# Patient Record
Sex: Male | Born: 1935 | Hispanic: No | Smoking: Never smoker
Health system: Southern US, Community
[De-identification: ages and names within clinical notes are randomized; demographics above are authoritative.]

## PROBLEM LIST (undated history)

## (undated) DIAGNOSIS — I10 Essential (primary) hypertension: Secondary | ICD-10-CM

## (undated) HISTORY — DX: Essential (primary) hypertension: I10

## (undated) HISTORY — PX: CATARACT EXTRACTION: SUR2

---

## 2001-10-22 ENCOUNTER — Emergency Department (HOSPITAL_COMMUNITY): Admission: EM | Admit: 2001-10-22 | Discharge: 2001-10-22 | Payer: Self-pay

## 2009-06-16 ENCOUNTER — Observation Stay (HOSPITAL_COMMUNITY): Admission: EM | Admit: 2009-06-16 | Discharge: 2009-06-16 | Payer: Self-pay | Admitting: Emergency Medicine

## 2010-07-31 LAB — URINALYSIS, ROUTINE W REFLEX MICROSCOPIC
Leukocytes, UA: NEGATIVE
Protein, ur: NEGATIVE mg/dL
Specific Gravity, Urine: 1.006 (ref 1.005–1.030)
pH: 6.5 (ref 5.0–8.0)

## 2010-07-31 LAB — POCT I-STAT, CHEM 8
Calcium, Ion: 0.94 mmol/L — ABNORMAL LOW (ref 1.12–1.32)
Creatinine, Ser: 0.9 mg/dL (ref 0.4–1.5)
Glucose, Bld: 93 mg/dL (ref 70–99)
HCT: 39 % (ref 39.0–52.0)
Hemoglobin: 13.3 g/dL (ref 13.0–17.0)
Potassium: 3.1 mEq/L — ABNORMAL LOW (ref 3.5–5.1)
TCO2: 22 mmol/L (ref 0–100)

## 2010-07-31 LAB — POCT CARDIAC MARKERS
CKMB, poc: <1 ng/mL — ABNORMAL LOW (ref 1.0–8.0)
Myoglobin, poc: 65.6 ng/mL (ref 12–200)
Myoglobin, poc: 66.5 ng/mL (ref 12–200)
Troponin i, poc: <0.05 ng/mL (ref 0.00–0.09)
Troponin i, poc: <0.05 ng/mL (ref 0.00–0.09)

## 2010-11-09 IMAGING — CR DG CHEST 2V
2 series · 2 of 2 positions shown · non-contrast
Comparison: None.

CLINICAL DATA: Fever.  Syncope.  Flu-like symptoms.

CHEST - 2 VIEW

[view not recorded (1 of 2)]
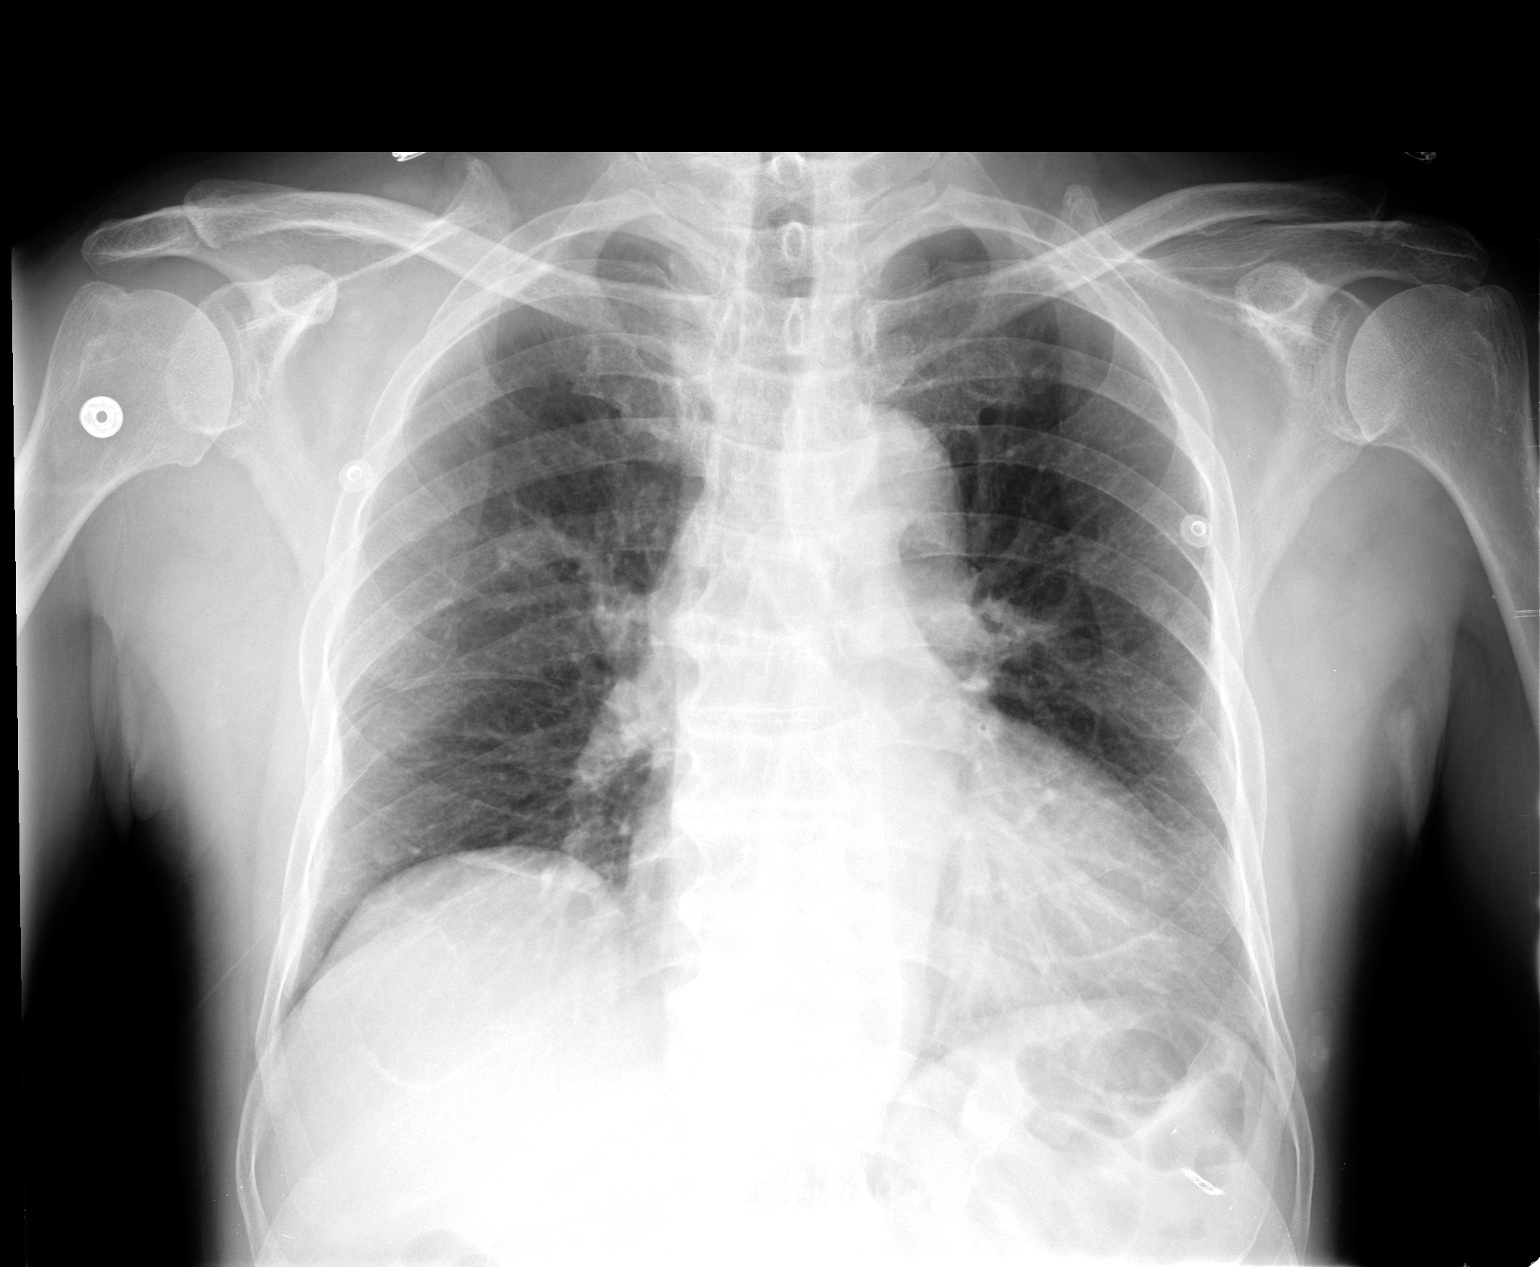

[view not recorded (2 of 2)]
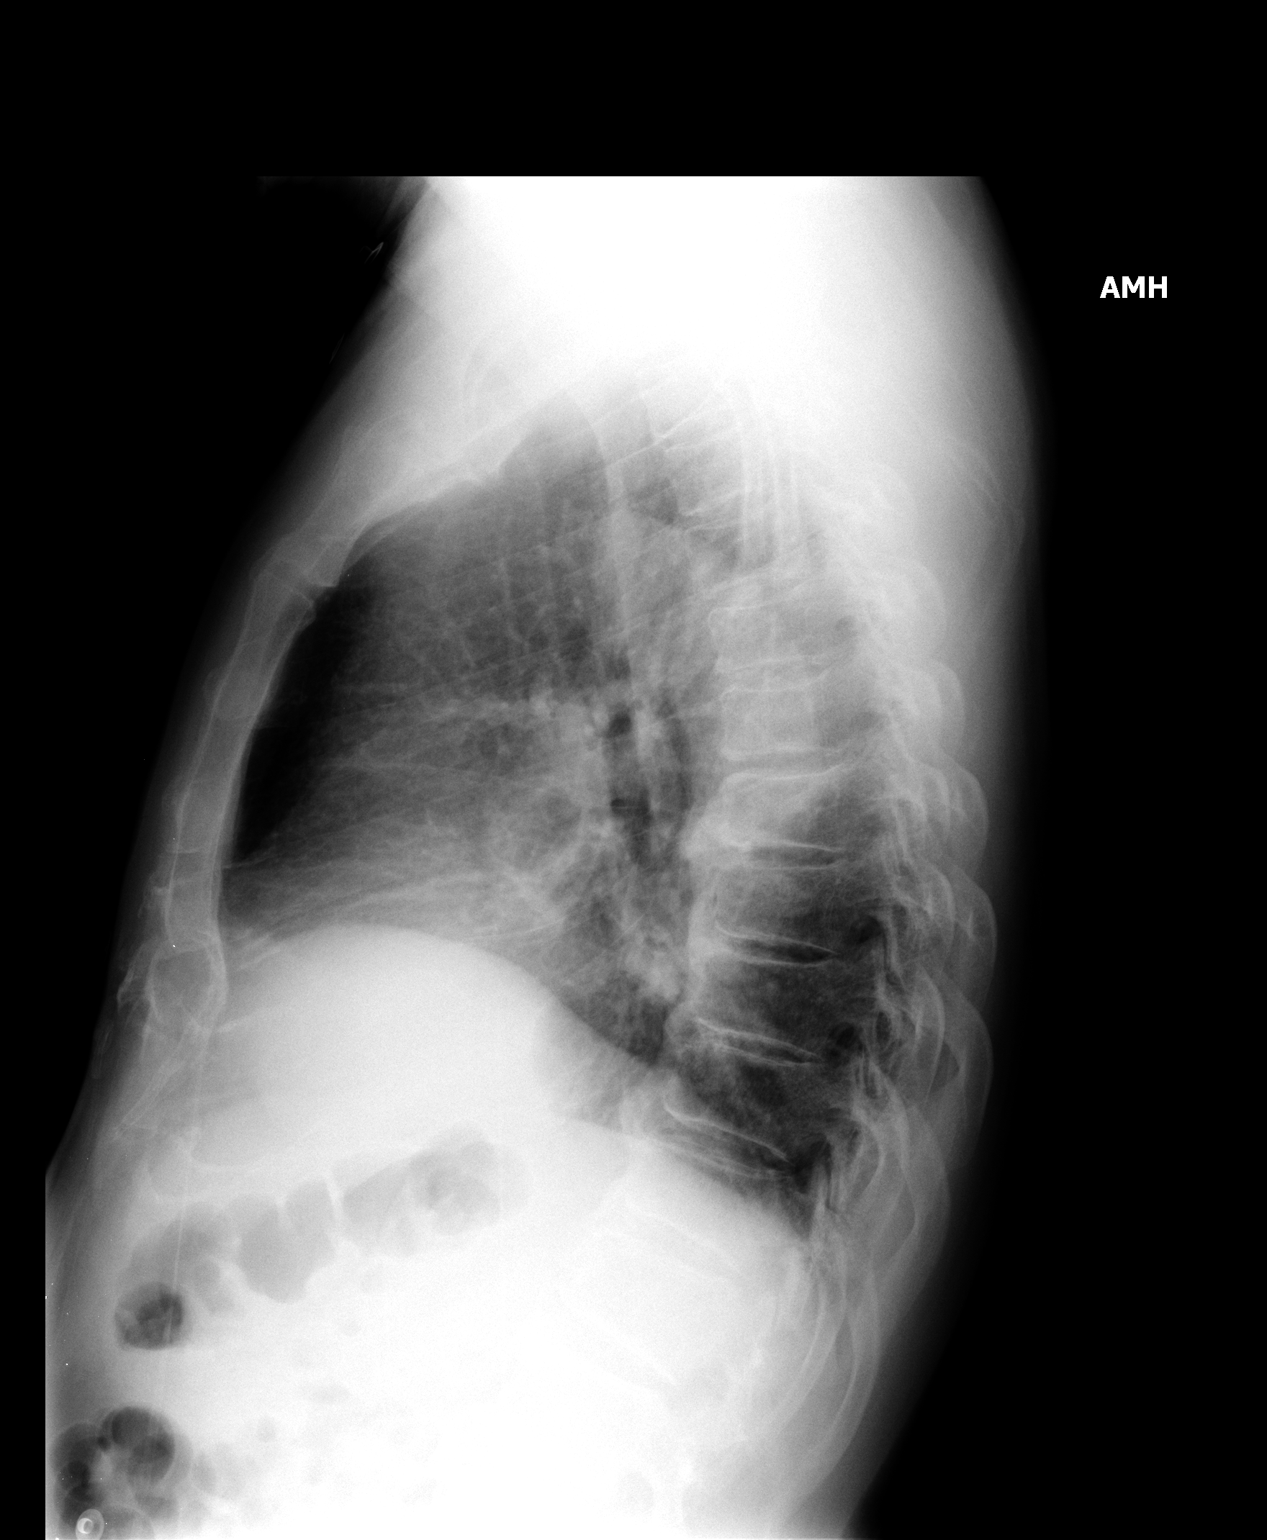

[2 of 2 positions shown; findings below may reference images not displayed]

FINDINGS: There is mild cardiomegaly.  Vascularity is normal.
There is a 5 mm nodule in the left midlung zone.  There is a 8 mm
nodule at the right lung base which is probably a nipple shadow
were scanned lesion, being been very well demarcated.  There is a
vague 12 mm density in the right midlung zone overlying the
posterior aspect of the right sixth rib which may represent a
confluence of normal structures but is more dense than the
surrounding tissues the possibility of a small nodule should be
considered.

There are no acute infiltrates or effusions.  Osteophytes are
present throughout the thoracic spine.
IMPRESSION: No pneumonia or effusions.

Mild cardiomegaly.

Possible nodules in the right and left midlung zones.  No prior
studies available for comparison.  CT scan may be useful for
further evaluation on an elective basis.

## 2014-03-02 ENCOUNTER — Encounter: Payer: Self-pay | Admitting: Family Medicine

## 2014-03-02 ENCOUNTER — Ambulatory Visit (INDEPENDENT_AMBULATORY_CARE_PROVIDER_SITE_OTHER): Payer: Medicare Other | Admitting: Family Medicine

## 2014-03-02 VITALS — BP 152/92 | HR 70 | Temp 98.2°F | Resp 16 | Ht 64.5 in | Wt 143.0 lb

## 2014-03-02 DIAGNOSIS — R350 Frequency of micturition: Secondary | ICD-10-CM

## 2014-03-02 DIAGNOSIS — R519 Headache, unspecified: Secondary | ICD-10-CM

## 2014-03-02 DIAGNOSIS — G8929 Other chronic pain: Secondary | ICD-10-CM

## 2014-03-02 DIAGNOSIS — N400 Enlarged prostate without lower urinary tract symptoms: Secondary | ICD-10-CM

## 2014-03-02 DIAGNOSIS — R319 Hematuria, unspecified: Secondary | ICD-10-CM

## 2014-03-02 DIAGNOSIS — R51 Headache: Secondary | ICD-10-CM

## 2014-03-02 DIAGNOSIS — I10 Essential (primary) hypertension: Secondary | ICD-10-CM

## 2014-03-02 LAB — POCT URINALYSIS DIPSTICK
Bilirubin, UA: NEGATIVE
Glucose, UA: NEGATIVE
Ketones, UA: NEGATIVE
Leukocytes, UA: NEGATIVE
Nitrite, UA: NEGATIVE
Protein, UA: NEGATIVE
Spec Grav, UA: 1.015
Urobilinogen, UA: 0.2
pH, UA: 7

## 2014-03-02 LAB — COMPLETE METABOLIC PANEL WITH GFR
ALT: 9 U/L (ref 0–53)
AST: 25 U/L (ref 0–37)
Alkaline Phosphatase: 53 U/L (ref 39–117)
BUN: 14 mg/dL (ref 6–23)
CO2: 29 mEq/L (ref 19–32)
Calcium: 9.2 mg/dL (ref 8.4–10.5)

## 2014-03-02 LAB — POCT UA - MICROSCOPIC ONLY
Bacteria, U Microscopic: NEGATIVE
Casts, Ur, LPF, POC: NEGATIVE
Crystals, Ur, HPF, POC: NEGATIVE
Mucus, UA: NEGATIVE
Yeast, UA: NEGATIVE

## 2014-03-02 LAB — POCT CBC
Granulocyte percent: 58.8 % (ref 37–80)
HCT, POC: 43.2 % — AB (ref 43.5–53.7)
Hemoglobin: 13.8 g/dL — AB (ref 14.1–18.1)
Lymph, poc: 2.1 (ref 0.6–3.4)
MCH, POC: 29.8 pg (ref 27–31.2)
MCHC: 32 g/dL (ref 31.8–35.4)
MCV: 93 fL (ref 80–97)
MID (cbc): 0.2 (ref 0–0.9)
MPV: 7.6 fL (ref 0–99.8)
POC Granulocyte: 3.2 (ref 2–6.9)
POC LYMPH PERCENT: 38 % (ref 10–50)
POC MID %: 3.2 %M (ref 0–12)
Platelet Count, POC: 177 10*3/uL (ref 142–424)
RBC: 4.65 M/uL — AB (ref 4.69–6.13)
RDW, POC: 14.3 %
WBC: 5.4 10*3/uL (ref 4.6–10.2)

## 2014-03-02 LAB — COMPLETE METABOLIC PANEL WITHOUT GFR
Albumin: 4.4 g/dL (ref 3.5–5.2)
Chloride: 101 meq/L (ref 96–112)
Creat: 0.82 mg/dL (ref 0.50–1.35)
GFR, Est African American: >89 mL/min
GFR, Est Non African American: 85 mL/min
Glucose, Bld: 88 mg/dL (ref 70–99)
Potassium: 4 meq/L (ref 3.5–5.3)
Sodium: 137 meq/L (ref 135–145)
Total Bilirubin: 0.8 mg/dL (ref 0.2–1.2)
Total Protein: 7.7 g/dL (ref 6.0–8.3)

## 2014-03-02 MED ORDER — AMLODIPINE BESYLATE 2.5 MG PO TABS: 2.5000 mg | ORAL_TABLET | Freq: Every day | ORAL | Status: AC

## 2014-03-02 MED ORDER — TAMSULOSIN HCL 0.4 MG PO CAPS: 0.4000 mg | ORAL_CAPSULE | Freq: Every day | ORAL | Status: AC

## 2014-03-02 NOTE — Patient Instructions (Signed)
Benign Prostatic Hyperplasia An enlarged prostate (benign prostatic hyperplasia) is common in older men. You may experience the following:  Weak urine stream.  Dribbling.  Feeling like the bladder has not emptied completely.  Difficulty starting urination.  Getting up frequently at night to urinate.  Urinating more frequently during the day. HOME CARE INSTRUCTIONS  Monitor your prostatic hyperplasia for any changes. The following actions may help to alleviate any discomfort you are experiencing:  Give yourself time when you urinate.  Stay away from alcohol.  Avoid beverages containing caffeine, such as coffee, tea, and colas, because they can make the problem worse.  Avoid decongestants, antihistamines, and some prescription medicines that can make the problem worse.  Follow up with your health care provider for further treatment as recommended. SEEK MEDICAL CARE IF:  You are experiencing progressive difficulty voiding.  Your urine stream is progressively getting narrower.  You are awaking from sleep with the urge to void more frequently.  You are constantly feeling the need to void.  You experience loss of urine, especially in small amounts. SEEK IMMEDIATE MEDICAL CARE IF:   You develop increased pain with urination or are unable to urinate.  You develop severe abdominal pain, vomiting, a high fever, or fainting.  You develop back pain or blood in your urine. MAKE SURE YOU:   Understand these instructions.  Will watch your condition.  Will get help right away if you are not doing well or get worse. Document Released: 04/27/2005 Document Revised: 12/28/2012 Document Reviewed: 09/27/2012 [facility] Patient Information 2015 [place], [place]. This information is not intended to replace advice given to you by your health care provider. Make sure you discuss any questions you have with your health care provider. Hypertension Hypertension, commonly called high blood  pressure, is when the force of blood pumping through your arteries is too strong. Your arteries are the blood vessels that carry blood from your heart throughout your body. A blood pressure reading consists of a higher number over a lower number, such as 110/72. The higher number (systolic) is the pressure inside your arteries when your heart pumps. The lower number (diastolic) is the pressure inside your arteries when your heart relaxes. Ideally you want your blood pressure below 120/80. Hypertension forces your heart to work harder to pump blood. Your arteries may become narrow or stiff. Having hypertension puts you at risk for heart disease, stroke, and other problems.  RISK FACTORS Some risk factors for high blood pressure are controllable. Others are not.  Risk factors you cannot control include:   Race. You may be at higher risk if you are African American.  Age. Risk increases with age.  Gender. Men are at higher risk than women before age 70 years. After age 18, women are at higher risk than men. Risk factors you can control include:  Not getting enough exercise or physical activity.  Being overweight.  Getting too much fat, sugar, calories, or salt in your diet.  Drinking too much alcohol. SIGNS AND SYMPTOMS Hypertension does not usually cause signs or symptoms. Extremely high blood pressure (hypertensive crisis) may cause headache, anxiety, shortness of breath, and nosebleed. DIAGNOSIS  To check if you have hypertension, your health care provider will measure your blood pressure while you are seated, with your arm held at the level of your heart. It should be measured at least twice using the same arm. Certain conditions can cause a difference in blood pressure between your right and left arms. A blood pressure reading  that is higher than normal on one occasion does not mean that you need treatment. If one blood pressure reading is high, ask your health care provider about having it  checked again. TREATMENT  Treating high blood pressure includes making lifestyle changes and possibly taking medicine. Living a healthy lifestyle can help lower high blood pressure. You may need to change some of your habits. Lifestyle changes may include:  Following the DASH diet. This diet is high in fruits, vegetables, and whole grains. It is low in salt, red meat, and added sugars.  Getting at least 2 hours of brisk physical activity every week.  Losing weight if necessary.  Not smoking.  Limiting alcoholic beverages.  Learning ways to reduce stress. If lifestyle changes are not enough to get your blood pressure under control, your health care provider may prescribe medicine. You may need to take more than one. Work closely with your health care provider to understand the risks and benefits. HOME CARE INSTRUCTIONS  Have your blood pressure rechecked as directed by your health care provider.   Take medicines only as directed by your health care provider. Follow the directions carefully. Blood pressure medicines must be taken as prescribed. The medicine does not work as well when you skip doses. Skipping doses also puts you at risk for problems.   Do not smoke.   Monitor your blood pressure at home as directed by your health care provider. SEEK MEDICAL CARE IF:   You think you are having a reaction to medicines taken.  You have recurrent headaches or feel dizzy.  You have swelling in your ankles.  You have trouble with your vision. SEEK IMMEDIATE MEDICAL CARE IF:  You develop a severe headache or confusion.  You have unusual weakness, numbness, or feel faint.  You have severe chest or abdominal pain.  You vomit repeatedly.  You have trouble breathing. MAKE SURE YOU:   Understand these instructions.  Will watch your condition.  Will get help right away if you are not doing well or get worse. Document Released: 04/27/2005 Document Revised: 09/11/2013  Document Reviewed: 02/17/2013 [facility] Patient Information 2015 [place]ExitCare, [place]. This information is not intended to replace advice given to you by your health care provider. Make sure you discuss any questions you have with your health care provider. Ph ??i tuy?n ti?n li?t lnh tnh  (Benign Prostatic Hypertrophy) Tuy?n ti?n li?t l m?t b? ph?n c?a h? sinh s?n ? nam gi?i. Tuy?n ti?n li?t bnh th??ng c kch th??c v hnh d?ng g?n gi?ng h?t d?. Tuy?n ti?n li?t t?o ra m?t lo?i d?ch tr?n l?n v?i tinh trng ?? t?o thnh tinh d?ch. Tuy?n ny bao quanh ni?u ??o v n?m ? m?t tr??c tr?c trng v ngay d??i bng quang. Bng quang l n?i ch?a n??c ti?u. Ni?u ??o l ?ng cho n??c ti?u t? bng quang ?i qua ?? ra kh?i c? th?. Tuy?n ti?n li?t to ra khi nam gi?i gi ?i. Tuy?n ti?n li?t ph ??i khng ph?i do b?nh ung th? gy ra ???c g?i l ph ??i tuy?n ti?n li?t lnh tnh (benign prostatic hypertrophy, BPH). Tuy?n ti?n li?t ph ??i c th? chn p [place] ni?u ??o. ?i?u ny c th? lm cho kh ?i ti?u h?n. Trong giai ?o?n ??u c?a s? ph ??i, bng quang c th? ???c ti?p c?n v?i m?t ni?u ??o thu h?p b?ng cch bu?c n??c ti?u ?i qua. N?u v?n ?? tr? nn t?i t? h?n, c th? ph?i c?n ?i?u tr? n?i khoa ho?c  ph?u thu?t.  Tnh tr?ng ny c?n ???c chuyn gia ch?m [place] s?c kh?e theo di. Tch t? n??c ti?u trong bng quang c th? gy nhi?m trng. p l?c ng??c dng v nhi?m trng c th? ti?n tri?n thnh t?n th??ng bng quang v suy (th?n). N?u c?n, chuyn gia ch?m [place] s?c kh?e c th? gi?i thi?u qu v? ??n m?t chuyn gia v? b?nh th?n v tuy?n ti?n li?t(bc s? chuyn khoa ti?t ni?u). NGUYN NHN  BPH l m?t v?n ?? v? s?c kh?e ph? bi?n ? nam gi?i trn 50 tu?i. Tnh tr?ng ny l m?t ph?n bnh th??ng c?a qu trnh lo ha. Tuy nhin, khng ph?i t?t c? nam gi?i ??u b? cc v?n ?? c?a tnh tr?ng ny. N?u ph ??i pht tri?n ra xa ni?u ??o, lc ?, s? khng c b?t c? chn p no ? ni?u ??o v khng c c?n tr? dng n??c ti?u. N?u ph ??i pht tri?n v? pha  ni?u ??o v gy chn p, qu v? s? kh ?i ti?u.  TRI?U CH?NG   Khng th? ?i h?t n??c ti?u trong bng quang.  Th?c d?y th??ng xuyn trong ?m ?? ?i ti?u.  C?n ?i ti?u th??ng xuyn trong ngy.  Kh kh?n khi b?t ??u ?i n??c ti?u.  Gi?m kch th??c v ?? m?nh c?a dng n??c ti?u.  Nh? gi?t n??c ti?u sau khi ?i ti?u.  ?au khi ?i ti?u (ph? bi?n h?n khi c nhi?m trng).  Khng th? ?i ti?u. Hi?n t??ng ny c?n ph?i ?i?u tr? ngay l?p t?c.  Pht tri?n nhi?m trng ???ng ti?u. CH?N ?ON  Cc xt nghi?m sau s? gip chuyn gia ch?m [place] s?c kh?e hi?u r v?n ?? c?a qu v?:  [place]hai thc k? ti?n s? v khm th?c th?.  Ti?n s? v? ti?t ni?u, v?i s? l?n ?i ti?u, l??ng n??c ti?u, ?? m?nh c?a dng n??c ti?u, v c?m gic h?t ho?c ??y bng quang sau khi ?i ti?u.  Ch?p qut l??ng n??c ti?u cn l?i trong bng quang ?? ?o l??ng n??c ti?u c th? v?n cn l?i trong bng quang sau khi qu v? ?i ti?u.  Ki?m tra tr?c trng b?ng ngn tay. Trong l?n ki?m tra tr?c trng, chuyn gia ch?m [place] s?c kh?e s? ki?m tra tuy?n ti?n li?t c?a qu v? b?ng cch cho m?t ngn tay ?eo g?ng, ? bi tr?n [place] tr?c trng ?? s? m?t sau c?a tuy?n ti?n li?t. Ki?m tra ny pht hi?n kch th??c c?a tuy?n v cc kh?i u ho?c s? pht tri?n b?t th??ng.  Xt nghi?m (phn tch n??c ti?u).  Sng l?c khng nguyn ??c hi?u tuy?n ti?n li?t (PSA). ?y l m?t xt nghi?m mu ???c s? d?ng ?? sng l?c ung th? tuy?n ti?n li?t.  Siu m tr?c trng. Ki?m tra ny s? d?ng sng m thanh ?? t?o hnh ?nh tuy?n ti?n li?t b?ng ph??ng php ?i?n t?. ?I?U TR?  Khi b?t ??u c tri?u ch?ng, chuyn gia ch?m [place] s?c kh?e s? theo di tnh tr?ng c?a qu v?. Trong s? nh?ng ng??i c tnh tr?ng ny, m?t ph?n ba s? c cc tri?u ch?ng ?n ??nh, m?t ph?n ba s? c cc tri?u ch?ng c?i thi?n, v m?t ph?n ba s? c cc tri?u ch?ng ti?n tri?n trong n?m ??u. Tri?u ch?ng nh? c th? khng c?n ?i?u tr?Cl[address] ??n gi?n v th?m khm hng n?m c th? l t?t c? nh?ng g c?n ph?i lm. Thu?c v ph?u  thu?t l nh?ng l?a ch?n ?i?u tr? nh?ng  v?n ?? nghim tr?ng h?n. Chuyn gia ch?m [place] s?c kh?e c th? gip qu v? ??a ra m?t quy?t ??nh sng su?t v? nh?ng ?i?u t?t nh?t. Hai lo?i thu?c c s?n ?? gi?m tri?u ch?ng c?a tuy?n ti?n li?t:  Thu?c lm thu nh? tuy?n ti?n li?t. Thu?c ny gip gi?m tri?u ch?ng. Nh?ng thu?c ny c?n c th?i gian ?? pht huy tc d?ng, v c th? m?t nhi?u thng tr??c khi th?y c?i thi?n.  Tc d?ng ph? khng ph? bi?n bao g?m cc v?n ?? v?i ch?c n?ng tnh d?c.  Thu?c ?? th? gin cc c? c?a tuy?n ti?n li?t. ?i?u ny c?ng lm gi?m t?c ngh?n thng qua gi?m chn p ln ni?u ??o. Nhm thu?c ny pht huy tc d?ng nhanh h?n nhm lm gi?m kch th??c tuy?n ti?n li?t. Thng th??ng, ng??i b?nh c th? c c?i thi?n trong nhi?u ngy ??n nhi?u tu?n.  Tc d?ng ph? c th? bao g?m chng m?t, m?t m?i, chong vng v xu?t tinh ng??c dng (gi?m kh?i l??ng xu?t tinh). C m?t vi ki?u ?i?u tr? ph?u thu?t ?? gi?m tri?u ch?ng c?a tuy?n ti?n li?t:  Ph?u thu?t c?t tuy?n ti?n li?t qua ni?u ??o (TURP) - Trong ph??ng php ?i?u tr? ny, m?t d?ng c? ???c ??a [place] thng qua l? ? ??u d??ng v?t. N ???c s? d?ng ?? c?t b? cc m?nh ? li bn trong tuy?n ti?n li?t. Cc m?nh ny ???c lo?i b? thng qua cng m?t l? ? ??u d??ng v?t. Cch ny s? lo?i b? t?c ngh?n v gip lm m?t cc tri?u ch?ng.  R?ch tuy?n ti?n li?t qua ni?u ??o (TUIP) - Trong th? thu?t ny, r?ch cc cc v?t nh? trong tuy?n ti?n li?t. Th? thu?t ny lm gi?m p l?c c?a tuy?n ti?n li?t ln ni?u ??o.  Li?u php nhi?t b?ng vi sng qua ni?u ??o (TUMT)-Th? thu?t ny s? d?ng vi sng ?? t?o ra nhi?t. Nhi?t s? ph h?y v lo?i b? m?t l??ng nh? m tuy?n ti?n li?t.  Tiu mn b?ng kim qua ni?u ??o (TUNA)-?y l th? thu?t s? d?ng t?n s? v tuy?n ?i?n ?? th?c hi?n t??ng t? nh? TUMT.  Lm ?ng m k? b?ng tia laser (ILC)-?y l th? thu?t s? d?ng laser ?? th?c hi?n t??ng t? nh? TUMT v TUNA.  ?i?n b?c bay tuy?n ti?n li?t qua ni?u ??o (TUVP)-?y l th? thu?t s? d?ng cc ?i?n  c?c ?? th?c hi?n t??ng t? nh? cc th? thu?t ???c li?t k ? trn. ?I KHM N?U:   Qu v? b? s?t.  ?au l?ng khng r nguyn nhn.  Cc tri?u ch?ng khng gi?m sau khi s? d?ng thu?c ? k ??n.  Qu v? b? nh?ng ph?n ?ng ph? do thu?c ?ang dng.  N??c ti?u c?a qu v? tr? nn r?t s?m mu ho?c c mi hi.  Vng b?ng d??i c?a qu v? ch??ng ln v ?i ti?u kh kh?n. NGAY L?P T?C ?I KHM N?U:   Qu v? ??t nhin khng th? ?i ti?u. ?y l tr??ng h?p kh?n c?p. Qu v? c?n ?i khm ngay l?p t?c.  C m?t l??ng l?n mu ho?c c?c mu ?ng trong n??c ti?u.  V?n ?? ?i ti?u c?a qu v? khng th? ki?m sot ???c.  Qu v? b? chong vng, chng m?t n?ng ho?c qu v? c?m th?y mu?n ng?t.  Qu v? b? ?au th?t l?ng ho?c ?au m?ng s??n t? m?c ?? trung bnh ??n m?c ?? n?ng.  qu v? b? ?n l?nh ho?c s?t. Document Released: 04/27/2005 Document  Revised: 05/02/2013 ExitCare Patient Information 2015 [place], [place]. This information is not intended to replace advice given to you by your health care provider. Make sure you discuss any questions you have with your health care provider. T?ng huy?t p (Hypertension) T?ng huy?t p, th??ng ???c g?i l huy?t p cao, l khi l?c b?m mu qua ??ng m?ch c?a qu v? qu m?nh. ??ng m?ch c?a qu v? l cc m?ch mu mang mu t? tim ?i kh?p c? th? c?a qu v?. K?t qu? ?o huy?t p c m?t con s? cao v m?t con s? th?p, ch?ng h?n 110/72. Con s? cao (tm thu) l p l?c bn trong ??ng m?ch khi tim qu v? b?m. Con s? th?p (tm tr??ng) l p l?c bn trong ??ng m?ch khi tim qu v? gin ra. Huy?t p l t??ng c?n cho qu v? ph?i l d??i 120/80. Ch?ng t?ng huy?t p bu?c tim qu v? ph?i lm vi?c v?t v? h?n ?? b?m mu. ??ng m?ch c?a qu v? c th? b? h?p ho?c c?ng. Ch?ng t?ng huy?t p lm qu v? c nguy c? b? b?nh tim, ??t qu? v cc v?n ?? khc.  CC Y?U T? NGUY C? M?t s? y?u t? nguy c? d?n ??n huy?t p cao c th? ki?m sot ???c. M?t s? y?u t? khc th khng.  Nh?ng y?u t? nguy c? khng th? ki?m sot ???c bao  g?m:   Ch?ng t?c. Qu v? c nguy c? cao h?n n?u qu v? l ng??i M? g?c Phi.  ?? tu?i. Nguy c? t?ng ln theo ?? tu?i.  Gi?i tnh. Nam gi?i c nguy c? cao h?n ph? n? tr??c tu?i 47. Sau tu?i 67, ph? n? c nguy c? cao h?n nam gi?i. Nh?ng y?u t? nguy c? c th? ki?m sot ???c bao g?m:  Khng t?p th? d?c ho?c cc ho?t ??ng th? ch?t ??y ??Marland Kitchen  Th?a cn.  ?n qu nhi?u ch?t bo, ???ng, ca-lo, ho?c mu?i.  U?ng qu nhi?u r??u. D?U HI?U V TRI?U CH?NG T?ng huy?t p th??ng khng gy ra d?u hi?u ho?c tri?u ch?ng. Huy?t p r?t cao (c?n cao huy?t p) c th? gy ?au ??u, lo l?ng, kh th?, v ch?y mu cam. CH?N ?ON  ?? ki?m tra xem qu v? c t?ng huy?t p khng, chuyn gia ch?m [place] s?c kh?e c?a qu v? s? ?o huy?t p trong khi qu v? ng?i ??t tay ? m?c ngang v?i tim. Huy?t p c?n ???c ?o t nh?t hai l?n trn cng m?t cnh tay. M?t s? tnh tr?ng nh?t ??nh c th? lm cho huy?t p khc nhau gi?a tay ph?i v tay tri c?a qu v?. K?t qu? ?o huy?t p cao h?n bnh th??ng ? m?t th?i ?i?m no ? khng c ngh?a l qu v? c?n ?i?u tr?Marland Kitchen N?u k?t qu? ?o huy?t p cao, hy h?i chuyn gia ch?m [place] s?c kh?e v? vi?c ki?m tra l?i huy?t p. ?I?U TR?  ?i?u tr? huy?t p cao gao g?m thay ??i l?i s?ng v c th? ph?i dng thu?c. C m?t l?i s?ng lnh m?nh c th? gip lm gi?m huy?t p cao. Qu v? c th? c?n thay ??i m?t s? thi quen. Thay ??i l?i s?ng c th? bao g?m:  Th?c hi?n ch? ?? ?n DASH. Ch? ?? ?n ny c nhi?u tri cy, rau, v ng? c?c nguyn h?t. C t mu?i, th?t ??, v t b? sung ???ng.  Hy dnh 2 1/2 ti?ng ho?t ??ng thn th? nhanh m?i tu?n.  Gi?m cn n?u  c?n thi?t.  Khng ht thu?c.  H?n ch? ?? u?ng c c?n.  H?c cc cch gi?m c?ng th?ng. N?u thay ??i l?i s?ng khng ?? ?? ??a huy?t p v? m?c c th? ki?m sot ???c, chuyn gia ch?m [place] s?c kh?e c th? k ??n thu?c. Qu v? c th? c?n dng nhi?u lo?i thu?c. Ph?i h?p ch?t ch? v?i chuyn gia ch?m [place] s?c kh?e ?? tm hi?u cc nguy c? v l?i ch. H??NG D?N CH?M Vincent T?I  NH  Ki?m tra l?i huy?t p c?a qu v? theo ch? d?n c?a chuyn gia ch?m [place] s?c kh?e.  Ch? s? d?ng thu?c theo ch? d?n c?a chuyn gia ch?m [place] s?c kh?e. Lm theo ch? d?n m?t cch c?n th?n. Thu?c ?i?u tr? huy?t p ph?i ???c dng theo ??n ? k. Thu?c c?ng s? khng c tc d?ng khi qu v? b? li?u. Vi?c b? li?u thu?c c?ng lm qu v? c nguy c? pht sinh v?n ??Imagene Sheller.  Khng ht thu?c.  Theo di huy?t p c?a qu v? ? nh theo ch? d?n c?a chuyn gia ch?m [place] s?c kh?e. ?I KHM N?U:   Qu v? ngh? qu v? c ph?n ?ng v?i thu?c ?ang dng.  Qu v? b? ?au ??u ho?c c?m th?y chng m?t ti di?n.  Qu v? b? s?ng ph ? m?t c chn.  Qu v? c v?n ?? v? th? l?c. NGAY L?P T?C ?I KHM N?U:  Qu v? b? ?au ??u n?ng ho?c l l?n.  Qu v? b? y?u b?t th??ng, t b, ho?c c?m th?y nh? ng?t x?u.  Qu v? b? ?au ng?c ho?c ?au b?ng r?t nhi?u.  Qu v? nn nhi?u l?n.  Qu v? b? kh th?. ??M B?O QU V?:   Hi?u r cc h??ng d?n ny.  S? theo di tnh tr?ng c?a mnh.  S? yu c?u tr? gip ngay l?p t?c n?u qu v? c?m th?y khng kh?e ho?c th?y tr?m tr?ng h?n. Document Released: 04/27/2005 Document Revised: 09/11/2013 [facility] Center[facility] Patient Information 2015 [place] DoceExitCare, [place]. This information is not intended to replace advice given to you by your health care provider. Make sure you discuss any questions you have with your health care provider.

## 2014-03-02 NOTE — Progress Notes (Signed)
Chief Complaint:  Chief Complaint  Patient presents with  . Headache    HPI: Edward Decker is a 80 y.o. male who is here for intermittent HA only on right side. Has some dizziness Denies any nausea or vomiting, numbness/tingling , vision changes, eye pain, No stroke sxs The HA is usually around noon, then after four pm he is fine, he takes advil about 2 pills, he takes it every 2-3 days. The HA is not all the time He has a history of HA and he has HTN but has not been taking HTn meds, he has been dx with HTN for a long time He eats a healthy diet, fish, brown rices and vegetables, he walks 2 miles a day. He states he doe snot want to take meds.   He has had increased frequency at night 3-4 times, no dysuria or hematuria, dribbling for several years Last annual was 3 yearsa go and not sure if he had a PSA done at that time No weight loss, night sweats, abd or pelvic pain No urinary sxs.    Past Medical History  Diagnosis Date  . Hypertension    History reviewed. No pertinent past surgical history. History   Social History  . Marital Status: Legally Separated    Spouse Name: N/A    Number of Children: N/A  . Years of Education: N/A   Social History Main Topics  . Smoking status: Never Smoker   . Smokeless tobacco: None  . Alcohol Use: None  . Drug Use: None  . Sexual Activity: None   Other Topics Concern  . None   Social History Narrative  . None   History reviewed. No pertinent family history. No Known Allergies Prior to Admission medications   Not on File     ROS: The patient denies fevers, chills, night sweats, unintentional weight loss, chest pain, palpitations, wheezing, dyspnea on exertion, nausea, vomiting, abdominal pain, dysuria, hematuria, melena, numbness, weakness, or tingling.   All other systems have been reviewed and were otherwise negative with the exception of those mentioned in the HPI and as above.    PHYSICAL EXAM: Filed Vitals:   03/02/14 0842  BP: 152/92  Pulse: 70  Temp: 98.2 F (36.8 C)  Resp: 16   Filed Vitals:   03/02/14 0842  Height: 5' 4.5" (1.638 m)  Weight: 143 lb (64.864 kg)   Body mass index is 24.18 kg/(m^2).  General: Alert, no acute distress HEENT:  Normocephalic, atraumatic, oropharynx patent. EOMI, PERRLA Cardiovascular:  Regular rate and rhythm, no rubs murmurs or gallops.  No Carotid bruits, radial pulse intact. No pedal edema.  Respiratory: Clear to auscultation bilaterally.  No wheezes, rales, or rhonchi.  No cyanosis, no use of accessory musculature GI: No organomegaly, abdomen is soft and non-tender, positive bowel sounds.  No masses. Skin: No rashes. Neurologic: Facial musculature symmetric. Psychiatric: Patient is appropriate throughout our interaction. Lymphatic: No cervical lymphadenopathy Musculoskeletal: Gait intact. + enlarged prostate , no nodularities   LABS: Results for orders placed in visit on 03/02/14  COMPLETE METABOLIC PANEL WITH GFR      Result Value Ref Range   Sodium 137  135 - 145 mEq/L   Potassium 4.0  3.5 - 5.3 mEq/L   Chloride 101  96 - 112 mEq/L   CO2 29  19 - 32 mEq/L   Glucose, Bld 88  70 - 99 mg/dL   BUN 14  6 - 23 mg/dL   Creat 1.610.82  0.50 - 1.35 mg/dL   Total Bilirubin 0.8  0.2 - 1.2 mg/dL   Alkaline Phosphatase 53  39 - 117 U/L   AST 25  0 - 37 U/L   ALT 9  0 - 53 U/L   Total Protein 7.7  6.0 - 8.3 g/dL   Albumin 4.4  3.5 - 5.2 g/dL   Calcium 9.2  8.4 - 08.6 mg/dL   GFR, Est African American >89     GFR, Est Non African American 85    POCT CBC      Result Value Ref Range   WBC 5.4  4.6 - 10.2 K/uL   Lymph, poc 2.1  0.6 - 3.4   POC LYMPH PERCENT 38.0  10 - 50 %L   MID (cbc) 0.2  0 - 0.9   POC MID % 3.2  0 - 12 %M   POC Granulocyte 3.2  2 - 6.9   Granulocyte percent 58.8  37 - 80 %G   RBC 4.65 (*) 4.69 - 6.13 M/uL   Hemoglobin 13.8 (*) 14.1 - 18.1 g/dL   HCT, POC 57.8 (*) 46.9 - 53.7 %   MCV 93.0  80 - 97 fL   MCH, POC 29.8  27 - 31.2  pg   MCHC 32.0  31.8 - 35.4 g/dL   RDW, POC 62.9     Platelet Count, POC 177  142 - 424 K/uL   MPV 7.6  0 - 99.8 fL  POCT UA - MICROSCOPIC ONLY      Result Value Ref Range   WBC, Ur, HPF, POC 0-1     RBC, urine, microscopic 4-11     Bacteria, U Microscopic neg     Mucus, UA neg     Epithelial cells, urine per micros 0-1     Crystals, Ur, HPF, POC neg     Casts, Ur, LPF, POC neg     Yeast, UA neg    POCT URINALYSIS DIPSTICK      Result Value Ref Range   Color, UA Yellow     Clarity, UA Clear     Glucose, UA Neg     Bilirubin, UA Neg     Ketones, UA Neg     Spec Grav, UA 1.015     Blood, UA Mod     pH, UA 7.0     Protein, UA Neg     Urobilinogen, UA 0.2     Nitrite, UA Neg     Leukocytes, UA Negative       EKG/XRAY:   Primary read interpreted by Dr. Conley Rolls at [facility].   ASSESSMENT/PLAN: Encounter Diagnoses  Name Primary?  . Essential hypertension Yes  . Chronic nonintractable headache, unspecified headache type   . Increased frequency of urination   . BPH (benign prostatic hyperplasia)   . Nonintractable headache, unspecified chronicity pattern, unspecified headache type   . Hematuria    80 y/o male who is not UTD on any of his screening exams, he comes to the office with a hx of intermittent HA on the right side, better with low dose advil No known triggers, Neuro exam is normal. He ahs a hx of HTn not on meds He is doing well overall, I suspect his intermittent headaches is due to HTN Will rx nrovasc 2.5 mg, may help with his HA as well  His prostate exam showed BPH, no nodularity, it is boggy and hard The son who is with him declines to get a PSA  after we discussed the risk and benefits I will rx him flomax 0.4 mg dailuy  They will return to see us in 1 month, may take advil prn  Precautions given for    Gross sideeffects, risk and benefits, and alternatives of medications d/w patient. Patient is aware that all medications have potential sideeffects and we are  unable to predict every sideeffect or drug-drug interaction that may occur.  Hamilton CapriLE, Avagail Whittlesey PHUONG, DO 03/02/2014 8:34 PM

## 2016-03-20 ENCOUNTER — Emergency Department (HOSPITAL_COMMUNITY): Payer: Self-pay

## 2016-03-20 ENCOUNTER — Encounter (HOSPITAL_COMMUNITY): Payer: Self-pay | Admitting: *Deleted

## 2016-03-20 ENCOUNTER — Emergency Department (HOSPITAL_COMMUNITY)
Admission: EM | Admit: 2016-03-20 | Discharge: 2016-03-20 | Disposition: A | Discharge status: Home / Self Care | Payer: Self-pay | Attending: Emergency Medicine | Admitting: Emergency Medicine

## 2016-03-20 DIAGNOSIS — N390 Urinary tract infection, site not specified: Secondary | ICD-10-CM | POA: Insufficient documentation

## 2016-03-20 DIAGNOSIS — R05 Cough: Secondary | ICD-10-CM | POA: Insufficient documentation

## 2016-03-20 DIAGNOSIS — I10 Essential (primary) hypertension: Secondary | ICD-10-CM | POA: Insufficient documentation

## 2016-03-20 LAB — BASIC METABOLIC PANEL
ANION GAP: 8 (ref 5–15)
BUN: 17 mg/dL (ref 6–20)
CALCIUM: 9.2 mg/dL (ref 8.9–10.3)
CO2: 26 mmol/L (ref 22–32)
Chloride: 103 mmol/L (ref 101–111)
Creatinine, Ser: 1.05 mg/dL (ref 0.61–1.24)
Glucose, Bld: 190 mg/dL — ABNORMAL HIGH (ref 65–99)
POTASSIUM: 3.9 mmol/L (ref 3.5–5.1)
SODIUM: 137 mmol/L (ref 135–145)

## 2016-03-20 LAB — URINE MICROSCOPIC-ADD ON

## 2016-03-20 LAB — CBC
HEMATOCRIT: 40 % (ref 39.0–52.0)
HEMOGLOBIN: 13.1 g/dL (ref 13.0–17.0)
MCH: 30.3 pg (ref 26.0–34.0)
MCHC: 32.8 g/dL (ref 30.0–36.0)
MCV: 92.6 fL (ref 78.0–100.0)
Platelets: 152 10*3/uL (ref 150–400)
RBC: 4.32 MIL/uL (ref 4.22–5.81)
RDW: 13.1 % (ref 11.5–15.5)
WBC: 11.4 10*3/uL — AB (ref 4.0–10.5)

## 2016-03-20 LAB — URINALYSIS, ROUTINE W REFLEX MICROSCOPIC
BILIRUBIN URINE: NEGATIVE
Glucose, UA: NEGATIVE mg/dL
KETONES UR: NEGATIVE mg/dL
NITRITE: NEGATIVE
PH: 6 (ref 5.0–8.0)
Protein, ur: 30 mg/dL — AB
SPECIFIC GRAVITY, URINE: 1.013 (ref 1.005–1.030)

## 2016-03-20 MED ORDER — CEPHALEXIN 500 MG PO CAPS: 500.0000 mg | ORAL_CAPSULE | Freq: Two times a day (BID) | ORAL | 0 refills | Status: AC

## 2016-03-20 MED ORDER — CEPHALEXIN 250 MG PO CAPS
500.0000 mg | ORAL_CAPSULE | Freq: Once | ORAL | Status: AC
  Administered 2016-03-20: 500 mg via ORAL
  Filled 2016-03-20: qty 2

## 2016-03-20 MED ORDER — ACETAMINOPHEN 500 MG PO TABS
500.0000 mg | ORAL_TABLET | Freq: Once | ORAL | Status: AC
  Administered 2016-03-20: 500 mg via ORAL
  Filled 2016-03-20: qty 1

## 2016-03-20 NOTE — ED Notes (Signed)
Patient transported to X-ray 

## 2016-03-20 NOTE — ED Notes (Signed)
Pt returned from xray

## 2016-03-20 NOTE — ED Provider Notes (Signed)
[facility]-EMERGENCY DEPT Provider Note   CSN: [id number] Arrival date & time: 03/20/16  0746     History   Chief Complaint Chief Complaint  Patient presents with  . Chills  . Dizziness    HPI Edward Decker is a 82 y.o. male.  HPI Patient does not speak much English and was translated by family member. Woke up this morning around 1 in the morning with chills cough dull headache and sinus problems. States he felt dizzy. States he felt a room was spinning. He woke up this morning and is feeling better. He has been able to eat a normal breakfast. Edward EstelleYesterday was normally formed. No nausea vomiting or diarrhea. No dysuria. He has a slight dull headache. No confusion. He has not been around anyone sick. No chest pain. No production with the cough.   Past Medical History:  Diagnosis Date  . Hypertension     There are no active problems to display for this patient.   Past Surgical History:  Procedure Laterality Date  . CATARACT EXTRACTION         Home Medications    Prior to Admission medications   Medication Sig Start Date End Date Taking? Authorizing Provider  ibuprofen (ADVIL,MOTRIN) 200 MG tablet Take 400-600 mg by mouth every 6 (six) hours as needed for headache or moderate pain.   Yes Historical Provider, MD  amLODipine (NORVASC) 2.5 MG tablet Take 1 tablet (2.5 mg total) by mouth daily. Patient not taking: Reported on 03/20/2016 03/02/14   Thao P Le, DO  cephALEXin (KEFLEX) 500 MG capsule Take 1 capsule (500 mg total) by mouth 2 (two) times daily. 03/20/16   Benjiman CoreNathan Atlas Kuc, MD  tamsulosin (FLOMAX) 0.4 MG CAPS capsule Take 1 capsule (0.4 mg total) by mouth daily. Patient not taking: Reported on 03/20/2016 03/02/14   Thao P Le, DO    Family History No family history on file.  Social History Social History  Substance Use Topics  . Smoking status: Never Smoker  . Smokeless tobacco: Never Used  . Alcohol use Yes     Comment: occ     Allergies   Patient has no known  allergies.   Review of Systems Review of Systems  Constitutional: Positive for chills. Negative for appetite change.  HENT: Negative for congestion.   Eyes: Negative for redness.  Respiratory: Positive for cough.   Cardiovascular: Negative for chest pain.  Gastrointestinal: Negative for nausea and vomiting.  Genitourinary: Negative for dysuria.  Musculoskeletal: Negative for back pain.  Neurological: Positive for dizziness. Negative for seizures.  Hematological: Negative for adenopathy.     Physical Exam Updated Vital Signs BP 154/76   Pulse 93   Temp 98.7 F (37.1 C) (Oral)   Resp (!) 28   Ht 5\' 4"  (1.626 m)   Wt 146 lb (66.2 kg)   SpO2 99%   BMI 25.06 kg/m   Physical Exam  Constitutional: He appears well-developed.  HENT:  Head: Atraumatic.  Eyes: Pupils are equal, round, and reactive to light.  Extraocular movements intact without nystagmus.  Neck: Neck supple.  Cardiovascular: Normal rate.   Pulmonary/Chest: Effort normal. He has no wheezes.  Abdominal: Soft. There is no tenderness.  Musculoskeletal: He exhibits no edema.  Neurological: He is alert. No cranial nerve deficit. Coordination normal.  Skin: Skin is warm. Capillary refill takes less than 2 seconds.  Psychiatric: He has a normal mood and affect.     ED Treatments / Results  Labs (all labs ordered are listed,  but only abnormal results are displayed) Labs Reviewed  BASIC METABOLIC PANEL - Abnormal; Notable for the following:       Result Value   Glucose, Bld 190 (*)    All other components within normal limits  CBC - Abnormal; Notable for the following:    WBC 11.4 (*)    All other components within normal limits  URINALYSIS, ROUTINE W REFLEX MICROSCOPIC (NOT [facility]) - Abnormal; Notable for the following:    APPearance TURBID (*)    Hgb urine dipstick LARGE (*)    Protein, ur 30 (*)    Leukocytes, UA LARGE (*)    All other components within normal limits  URINE MICROSCOPIC-ADD ON -  Abnormal; Notable for the following:    Squamous Epithelial / LPF 0-5 (*)    Bacteria, UA MANY (*)    All other components within normal limits  URINE CULTURE    EKG  EKG Interpretation  Date/Time:  Friday March 20 2016 07:56:46 EST Ventricular Rate:  100 PR Interval:  170 QRS Duration: 100 QT Interval:  354 QTC Calculation: 456 R Axis:   21 Text Interpretation:  Normal sinus rhythm Normal ECG Confirmed by Rubin PayorPICKERING  MD, Harrold DonathNATHAN [phone]([id number]) on 03/20/2016 9:12:08 AM       Radiology Dg Chest 2 View  Result Date: 03/20/2016 CLINICAL DATA:  Cough and fever for the past 4 days EXAM: CHEST  2 VIEW COMPARISON:  None in PACs FINDINGS: The lungs are adequately inflated. The interstitial markings are coarse. There is no alveolar infiltrate or pleural effusion. The mediastinum is normal in width. There is calcification in the wall of the aortic arch. The heart is top-normal to mildly enlarged. The pulmonary vascularity is not engorged. There is an approximately 4 mm diameter area of subtle nodularity projecting over the posterior lateral aspect of the left 6th rib. There is mild multilevel degenerative disc disease of the mid and lower thoracic spine. IMPRESSION: 1. Chronic bronchitic changes. No pneumonia. Cardiomegaly without pulmonary edema. 2. 5 mm subtle nodule projecting over the posterior lateral aspect of the left sixth rib. Chest CT scanning is recommended. 3. Aortic atherosclerosis. Electronically Signed   By: David  Swaziland[place] M.D.   On: 03/20/2016 09:30    Procedures Procedures (including critical care time)  Medications Ordered in ED Medications  cephALEXin (KEFLEX) capsule 500 mg (500 mg Oral Given 03/20/16 1012)  acetaminophen (TYLENOL) tablet 500 mg (500 mg Oral Given 03/20/16 1012)     Initial Impression / Assessment and Plan / ED Course  I have reviewed the triage vital signs and the nursing notes.  Pertinent labs & imaging results that were available during my care of the  patient were reviewed by me and considered in my medical decision making (see chart for details).  Clinical Course     Patient had chills and dizziness last night. Feels better now. Found to have urinary tract infection. Also bronchitis without pneumonia. Feels better and states been tolerating orals. Will discharge home with anabolic's. Urine culture sent.  Final Clinical Impressions(s) / ED Diagnoses   Final diagnoses:  Lower urinary tract infectious disease    New Prescriptions New Prescriptions   CEPHALEXIN (KEFLEX) 500 MG CAPSULE    Take 1 capsule (500 mg total) by mouth 2 (two) times daily.     Benjiman CoreNathan Hildagard Sobecki, MD 03/20/16 1046

## 2016-03-20 NOTE — ED Triage Notes (Signed)
Pt states woke up with chills, headache, L sinus pressure and dizziness at 1 am.  He woke up, walk to kitchen and eat which improved s/s.

## 2016-03-20 NOTE — Discharge Instructions (Signed)
Follow with his Doctor.

## 2016-03-22 LAB — URINE CULTURE

## 2016-03-23 ENCOUNTER — Telehealth (HOSPITAL_BASED_OUTPATIENT_CLINIC_OR_DEPARTMENT_OTHER): Payer: Self-pay | Admitting: Emergency Medicine

## 2016-03-23 NOTE — Telephone Encounter (Signed)
Post ED Visit - Positive Culture Follow-up  Culture report reviewed by antimicrobial stewardship pharmacist:  []  Edward Decker, Pharm.D. []  Edward Decker, Pharm.D., BCPS []  Edward Decker, Pharm.D. []  Edward Decker, Pharm.D., BCPS []  NoankMinh Decker, [address]Ph[address]D., BCPS, AAHIVP []  Edward Decker, Pharm.D., BCPS, AAHIVP []  Tennis Mustassie Decker, Pharm.D. []  Edward Decker, [address]Ph[address] Edward Decker  Positive urine culture Treated with cephalexin, organism sensitive to the same and no further patient follow-up is required at this time.  Edward Decker, Edward Decker 03/23/2016, 11:11 AM

## 2016-04-16 ENCOUNTER — Ambulatory Visit: Payer: Self-pay

## 2017-08-13 IMAGING — DX DG CHEST 2V
2 series · 2 of 2 positions shown · non-contrast
Comparison: None in PACs

CLINICAL DATA: Cough and fever for the past 4 days

EXAM:
CHEST  2 VIEW

[w chest pa]
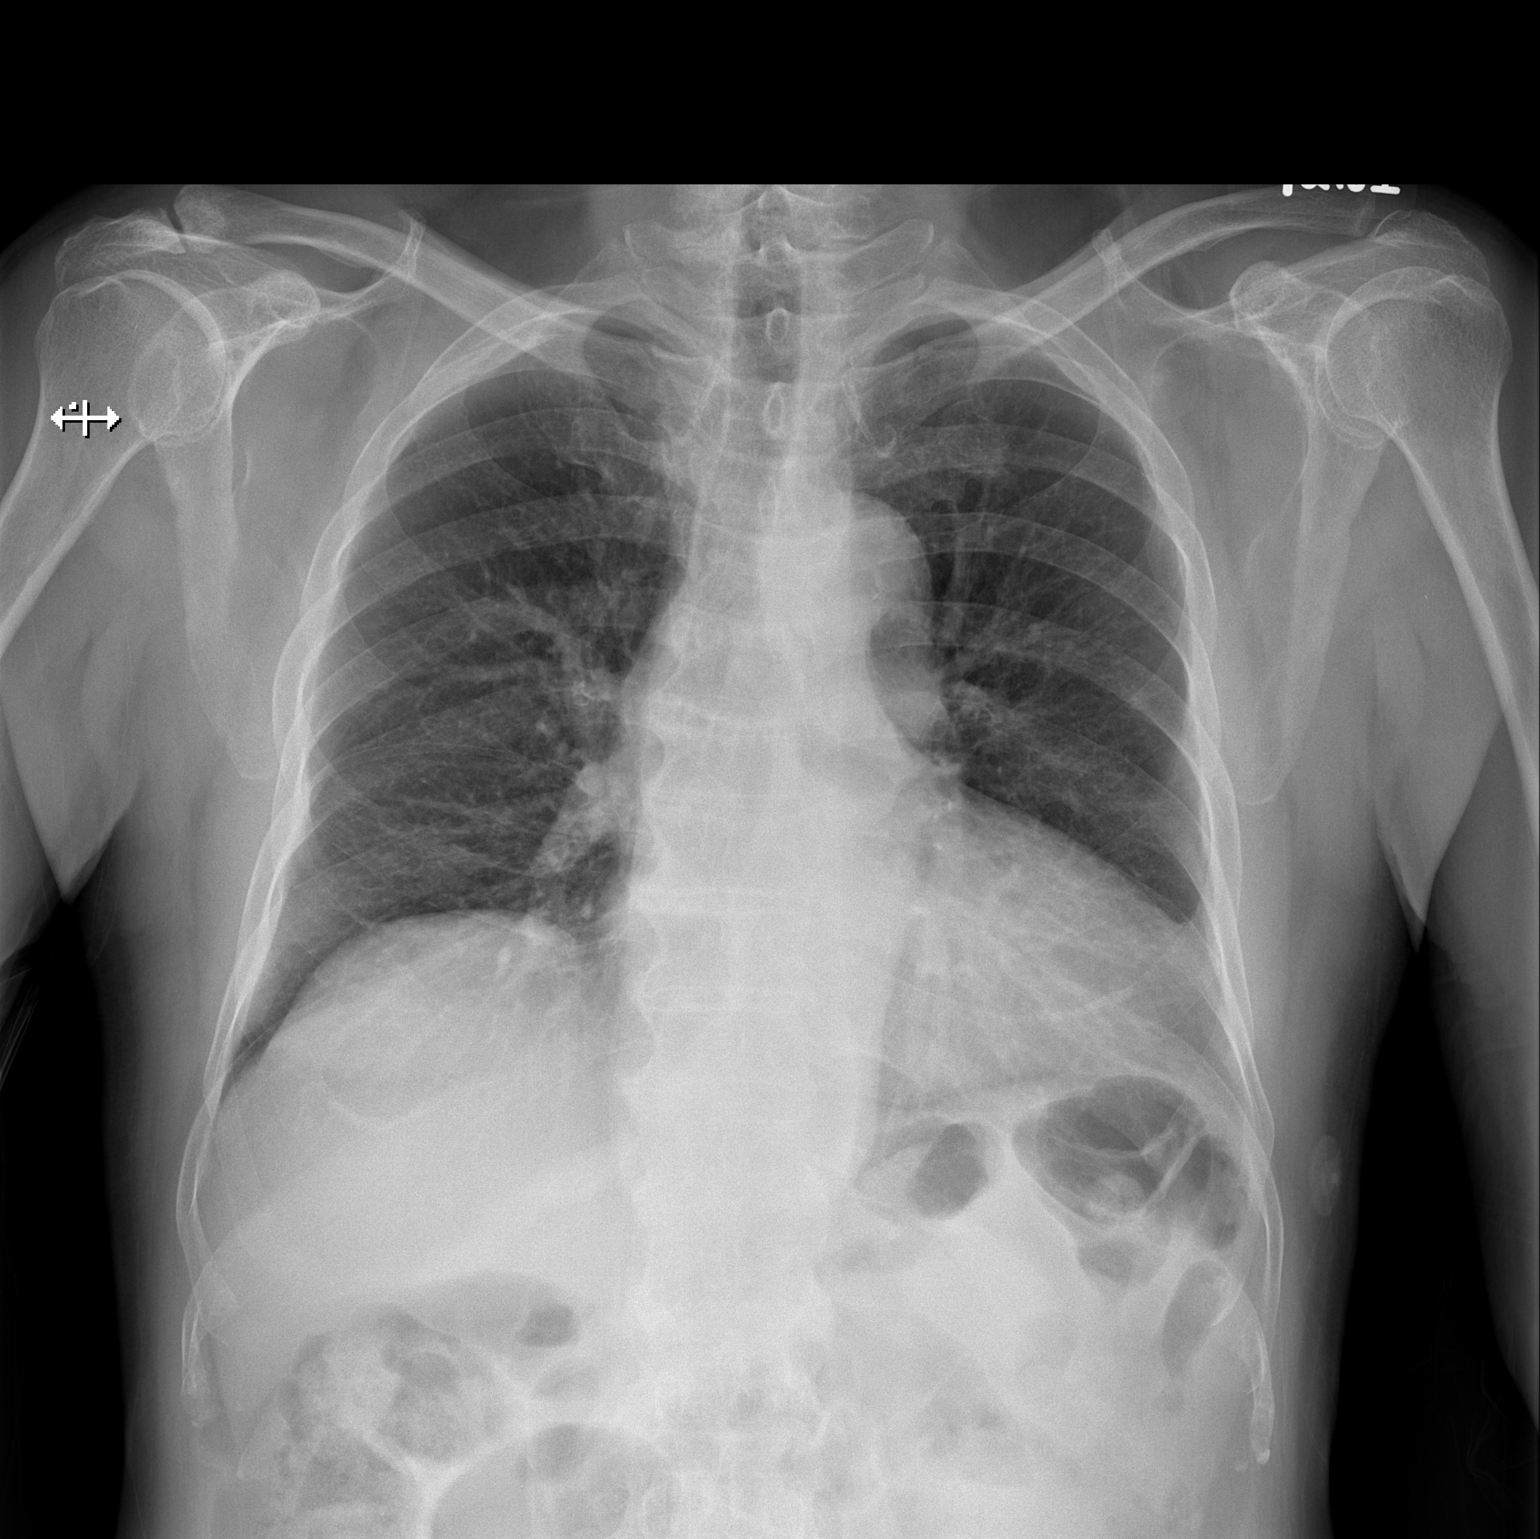

[w chest lat]
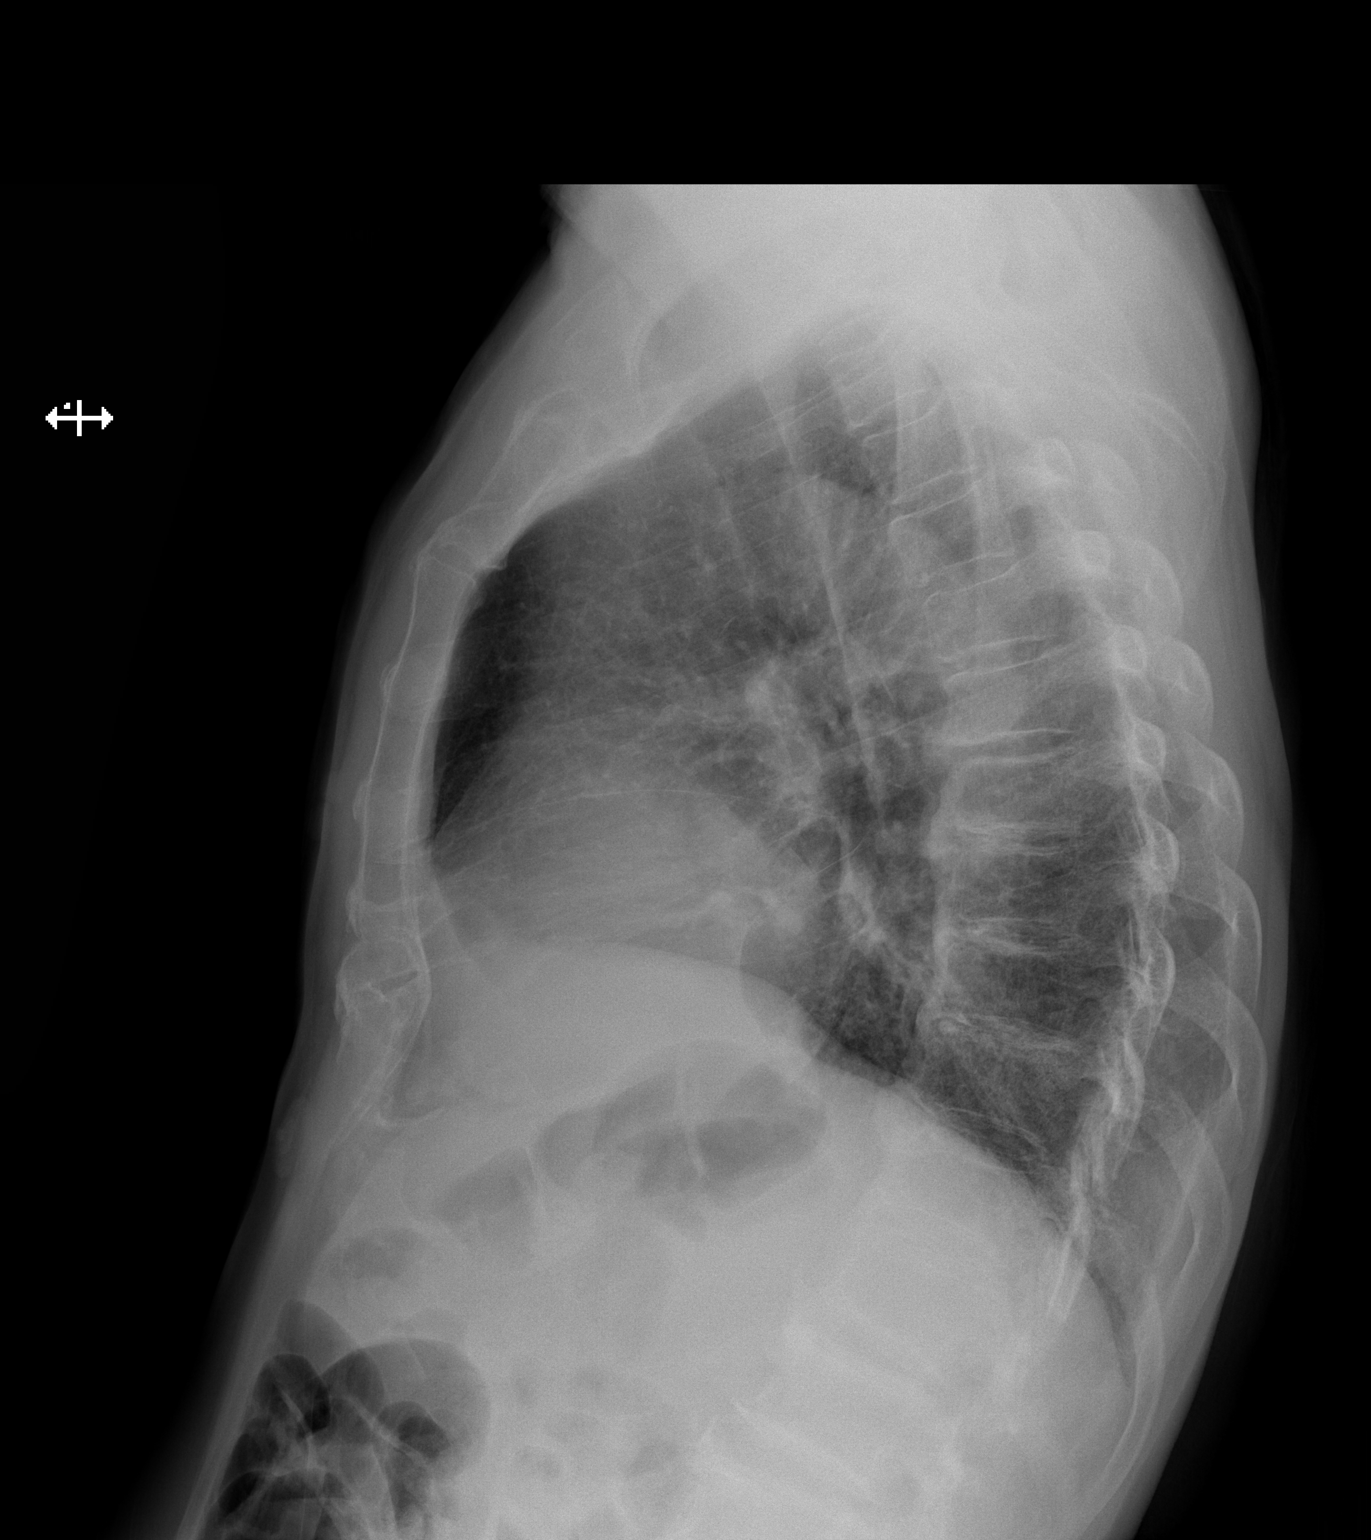

[2 of 2 positions shown; findings below may reference images not displayed]

FINDINGS: The lungs are adequately inflated. The interstitial markings are
coarse. There is no alveolar infiltrate or pleural effusion. The
mediastinum is normal in width. There is calcification in the wall
of the aortic arch. The heart is top-normal to mildly enlarged. The
pulmonary vascularity is not engorged. There is an approximately 4
mm diameter area of subtle nodularity projecting over the posterior
lateral aspect of the left 6th rib. There is mild multilevel
degenerative disc disease of the mid and lower thoracic spine.
IMPRESSION: 1. Chronic bronchitic changes. No pneumonia. Cardiomegaly without
pulmonary edema.
2. 5 mm subtle nodule projecting over the posterior lateral aspect
of the left sixth rib. Chest CT scanning is recommended.
3. Aortic atherosclerosis.
